# Patient Record
Sex: Female | Born: 1985 | Race: White | Hispanic: No | Marital: Married | Smoking: Never smoker
Health system: Southern US, Community
[De-identification: ages and names within clinical notes are randomized; demographics above are authoritative.]

## PROBLEM LIST (undated history)

## (undated) DIAGNOSIS — E119 Type 2 diabetes mellitus without complications: Secondary | ICD-10-CM

## (undated) DIAGNOSIS — E282 Polycystic ovarian syndrome: Secondary | ICD-10-CM

## (undated) HISTORY — PX: TONSILLECTOMY: SUR1361

## (undated) HISTORY — PX: BREAST SURGERY: SHX581

## (undated) HISTORY — PX: WISDOM TOOTH EXTRACTION: SHX21

---

## 2015-09-04 ENCOUNTER — Other Ambulatory Visit (HOSPITAL_COMMUNITY): Payer: Self-pay | Admitting: Unknown Physician Specialty

## 2015-09-04 DIAGNOSIS — Z3689 Encounter for other specified antenatal screening: Secondary | ICD-10-CM

## 2015-09-05 ENCOUNTER — Encounter (HOSPITAL_COMMUNITY): Payer: Self-pay | Admitting: Unknown Physician Specialty

## 2015-09-13 ENCOUNTER — Ambulatory Visit (HOSPITAL_COMMUNITY): Payer: Self-pay

## 2015-09-18 ENCOUNTER — Encounter (HOSPITAL_COMMUNITY): Payer: Self-pay

## 2015-09-18 ENCOUNTER — Ambulatory Visit (HOSPITAL_COMMUNITY)
Admission: RE | Admit: 2015-09-18 | Discharge: 2015-09-18 | Disposition: A | Discharge status: Home / Self Care | Payer: Managed Care, Other (non HMO) | Source: Ambulatory Visit | Attending: Unknown Physician Specialty | Admitting: Unknown Physician Specialty

## 2015-09-18 VITALS — BP 141/82 | HR 92 | Wt 253.4 lb

## 2015-09-18 DIAGNOSIS — O24419 Gestational diabetes mellitus in pregnancy, unspecified control: Secondary | ICD-10-CM | POA: Insufficient documentation

## 2015-09-18 DIAGNOSIS — Z3A15 15 weeks gestation of pregnancy: Secondary | ICD-10-CM | POA: Diagnosis not present

## 2015-09-18 DIAGNOSIS — O24912 Unspecified diabetes mellitus in pregnancy, second trimester: Secondary | ICD-10-CM

## 2015-09-18 DIAGNOSIS — Z9889 Other specified postprocedural states: Secondary | ICD-10-CM

## 2015-09-18 DIAGNOSIS — Z3689 Encounter for other specified antenatal screening: Secondary | ICD-10-CM

## 2015-09-18 HISTORY — DX: Polycystic ovarian syndrome: E28.2

## 2015-09-18 HISTORY — DX: Type 2 diabetes mellitus without complications: E11.9

## 2015-09-18 NOTE — Progress Notes (Signed)
MFM Consult  30 year old, G2P1001, currently at 15 weeks 5 days gestation with an EDD of 03/06/2016 seen in consultation today for maternal diabetes.   Past Medical History  Diagnosis Date  . Diabetes mellitus without complication (HCC)   . PCOS (polycystic ovarian syndrome)    Past Surgical History  Procedure Laterality Date  . Tonsillectomy    . Breast surgery      augmentation  x3  . Wisdom tooth extraction     NKDA  Impression and Recommendations #1 Gestational (probable type II diabetes given early onset of diagnosis with both her pregnancies) - she notes she had early diagnosis of diabetes during her last pregnancy, but that it appeared to resolve between pregnancy's. She was tested and diagnosed early this pregnancy and has a HgbA1c of 6.7. She was initially on oral hypoglycemics and converted to levimer about 2 weeks ago and was adjusted from 20 to 22 units SQ qHS. Will have her increase to 24 units and then if not in target range to 26 units of levimir. She is scheduled to meet with our diabetic educator in a couple weeks.  - fastings currently average 109 (target average <105 and ideally <95) - 1-hour postprandials appear less than 140's (target <140 on average) - targeted fetal anatomic survey ~19- [redacted] weeks gestation with pediatric fetal echo at ~[redacted] weeks gestation - serial ultrasounds for fetal growth at ~4 week intervals - maternal assessment of fetal movement starting ~[redacted] weeks gestation with immediate provider contact for decreased or cessation of fetal movement - twice weekly antenatal testing at ~32-[redacted] weeks gestation or as clinically indicated - delivery at ~[redacted] weeks gestation or as clinically indicated #2 History of shoulder dystocia - she notes that labor progressed well until the shoulder was stuck. The baby had a broken arm and dislocated shoulder per her report and did not breath spontaneously for 8 minutes after birth per the patient. The baby did not appear to  have residual symptoms at this point, but did have a delay in left arm function per the patient. The baby weighted 8 pounds 3 ounces at [redacted] weeks gestation - we discussed the risks benefits and alternatives of vaginal birth versus primary cesarean delivery - it appears that she is leaning strongly to primary cesarean delivery to avoid potential birth sequelae to the baby. #3 Family history of a cardiac defect - her brother weighed >11 pounds at birth and was noted to have 2 ventricular septal defects - would get a fetal echo evaluation with pediatric cardiology at ~[redacted] weeks gestation #4 Desire to breastfeed with a history of augmentation left breast and reduction right breast - consider lactation consultation antepartum in the 34-36 week range to coordinate feasibility and plan for after delivery  Questions appear answered to the couples satisfaction. Precautions for the above given. Spent greater than 1/2 of 40 minute visit face to face counseling

## 2015-09-27 ENCOUNTER — Ambulatory Visit (HOSPITAL_COMMUNITY): Payer: Self-pay

## 2015-10-18 ENCOUNTER — Other Ambulatory Visit (HOSPITAL_COMMUNITY): Payer: 59

## 2015-10-18 ENCOUNTER — Ambulatory Visit (HOSPITAL_COMMUNITY): Payer: Self-pay

## 2015-10-25 ENCOUNTER — Encounter: Payer: Managed Care, Other (non HMO) | Attending: Obstetrics | Admitting: *Deleted

## 2015-10-25 ENCOUNTER — Other Ambulatory Visit (HOSPITAL_COMMUNITY): Payer: Self-pay | Admitting: Unknown Physician Specialty

## 2015-10-25 ENCOUNTER — Encounter (HOSPITAL_COMMUNITY): Payer: Self-pay

## 2015-10-25 ENCOUNTER — Ambulatory Visit (HOSPITAL_COMMUNITY)
Admission: RE | Admit: 2015-10-25 | Discharge: 2015-10-25 | Disposition: A | Discharge status: Home / Self Care | Payer: Managed Care, Other (non HMO) | Source: Ambulatory Visit | Attending: Unknown Physician Specialty | Admitting: Unknown Physician Specialty

## 2015-10-25 DIAGNOSIS — Z029 Encounter for administrative examinations, unspecified: Secondary | ICD-10-CM | POA: Insufficient documentation

## 2015-10-25 DIAGNOSIS — O24419 Gestational diabetes mellitus in pregnancy, unspecified control: Secondary | ICD-10-CM

## 2015-10-25 DIAGNOSIS — Z3689 Encounter for other specified antenatal screening: Secondary | ICD-10-CM

## 2015-10-25 DIAGNOSIS — Z3A21 21 weeks gestation of pregnancy: Secondary | ICD-10-CM

## 2015-10-25 DIAGNOSIS — O24414 Gestational diabetes mellitus in pregnancy, insulin controlled: Secondary | ICD-10-CM | POA: Insufficient documentation

## 2015-10-25 DIAGNOSIS — Z36 Encounter for antenatal screening of mother: Secondary | ICD-10-CM | POA: Diagnosis not present

## 2015-10-25 DIAGNOSIS — O99212 Obesity complicating pregnancy, second trimester: Secondary | ICD-10-CM

## 2015-10-25 NOTE — Progress Notes (Signed)
  Patient was seen on 10/25/2015 for Gestational Diabetes self-management. She has a history of GDM with last pregnancy almost 6 years ago. She is already testing her BG @ 4 times a day and is taking Levemir insulin @ 28 units in the evening and Metformin @ 1000 mg in AM and 1500 mg with supper meal. She states she is finally able to tolerate the Metformin now. Diet recall indicates she is limiting her carbohydrate to 20 grams at breakfast, 30 grams at lunch and 20-25 grams at supper. I reviewed Carb Counting with her and recommended she increase her carb intake to 30 grams at breakfast and 30-45 grams at lunch and supper to provide adequate glucose to prevent ketone production. She had no questions regarding testing her BG or taking her insulin.   Patient instructed to monitor glucose levels: FBS: 60 - <90 1 hour: <140 2 hour: <120  *Patient received handouts:  Nutrition Diabetes and Pregnancy  Carbohydrate Counting List  Patient will be seen for follow-up as needed.

## 2016-07-23 ENCOUNTER — Encounter (HOSPITAL_COMMUNITY): Payer: Self-pay

## 2017-04-09 IMAGING — US US MFM OB DETAIL+14 WK
1 series · 14 of 28 positions shown · non-contrast
Comparison: none

[Series 1: us mfm ob detail+14 wk · 118 acquisitions, 14 frames shown]
[im 5/118]
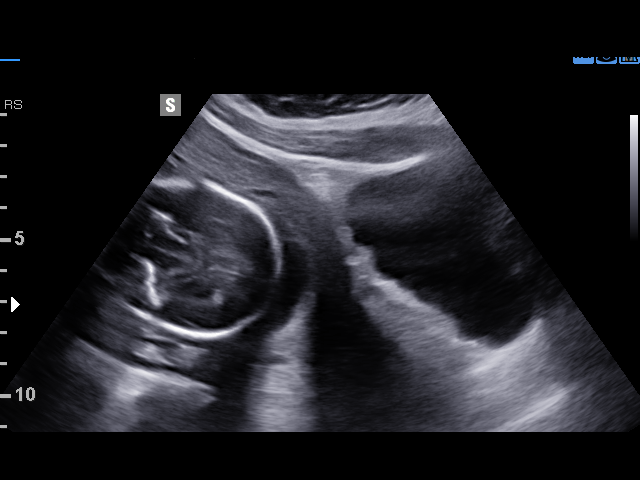
[im 14/118]
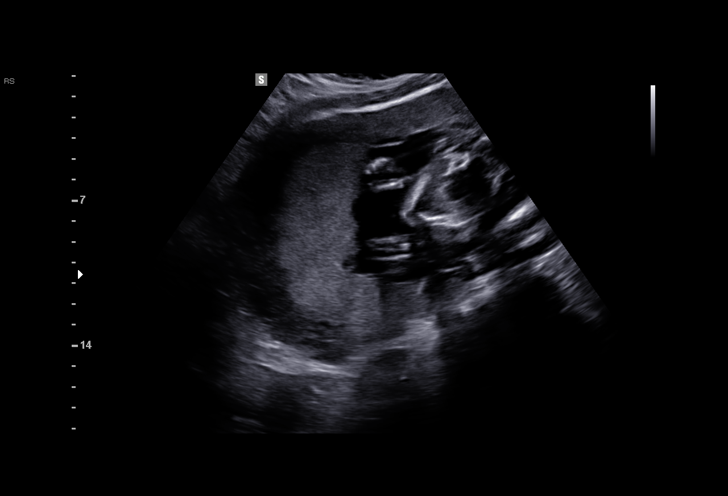
[im 22/118]
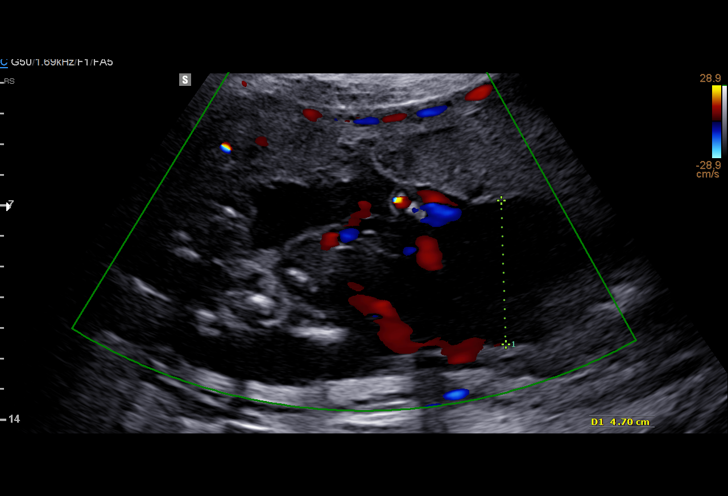
[im 31/118]
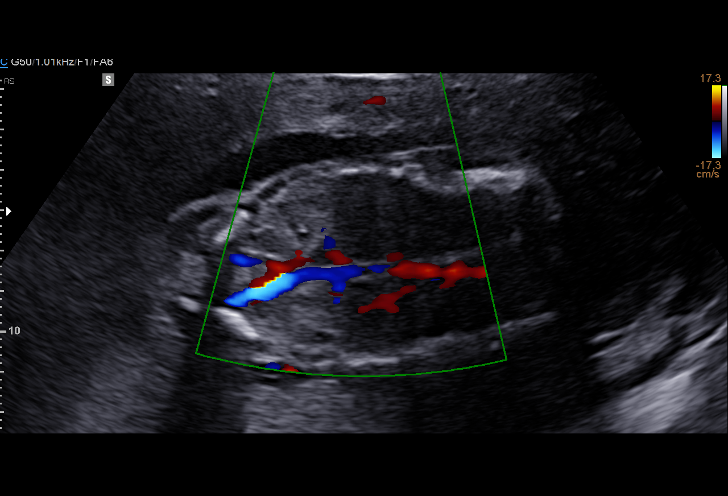
[im 40/118]
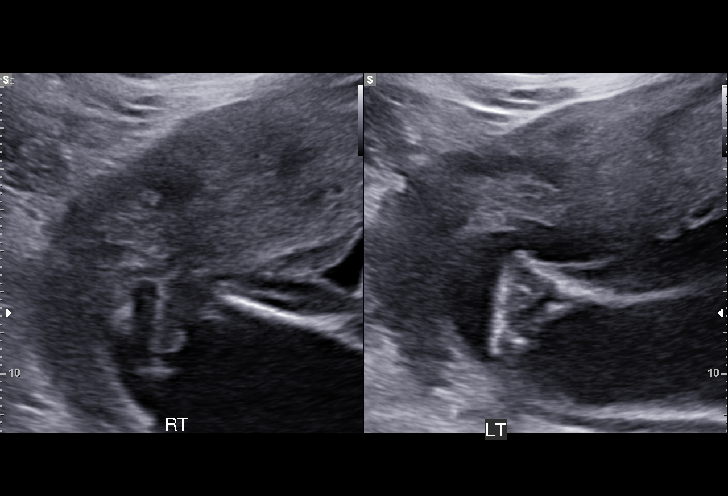
[im 48/118]
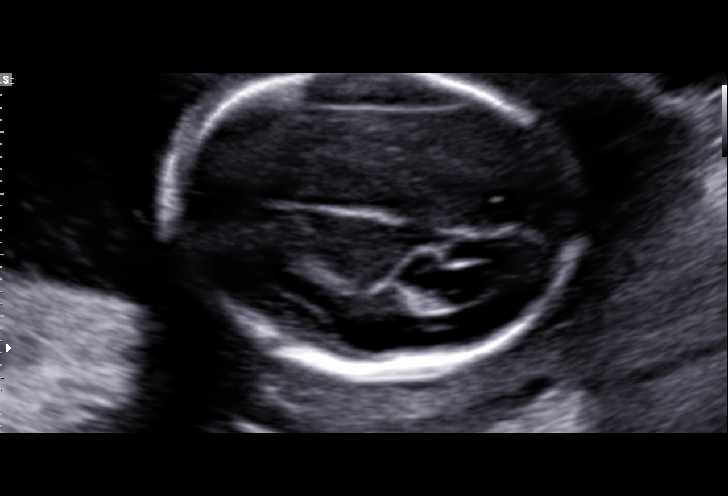
[im 57/118]
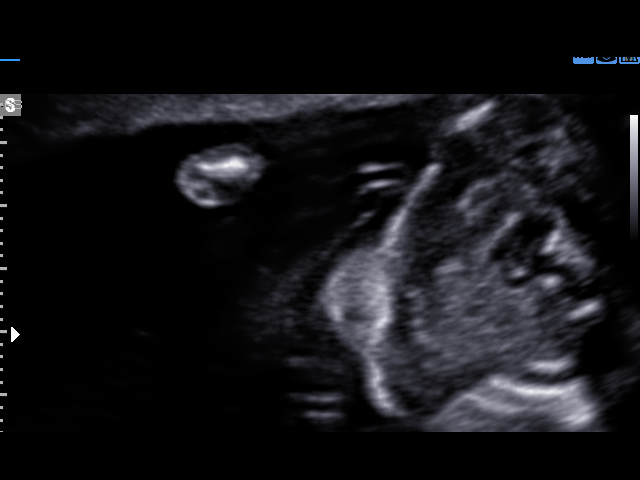
[im 66/118]
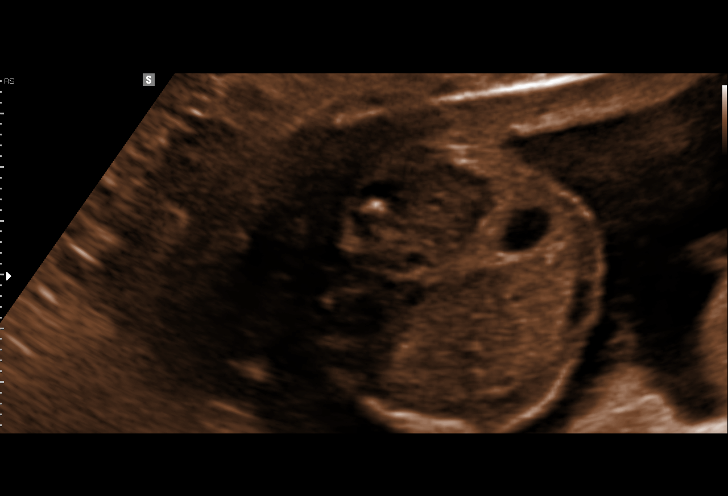
[im 74/118]
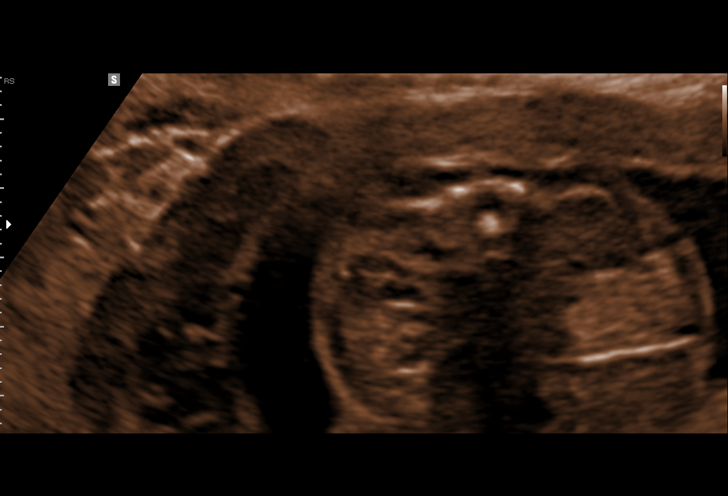
[im 83/118]
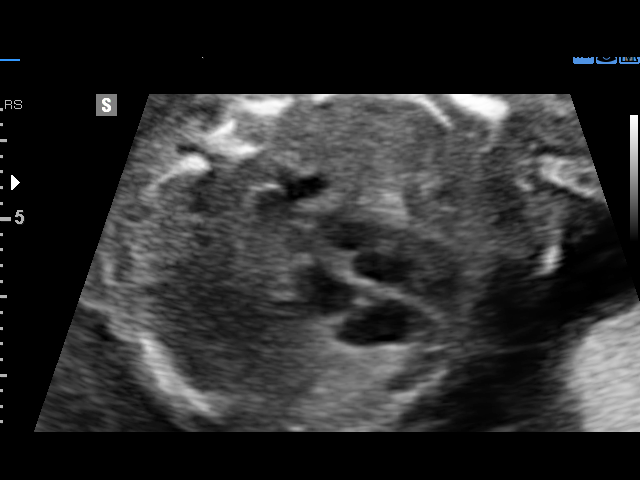
[im 92/118]
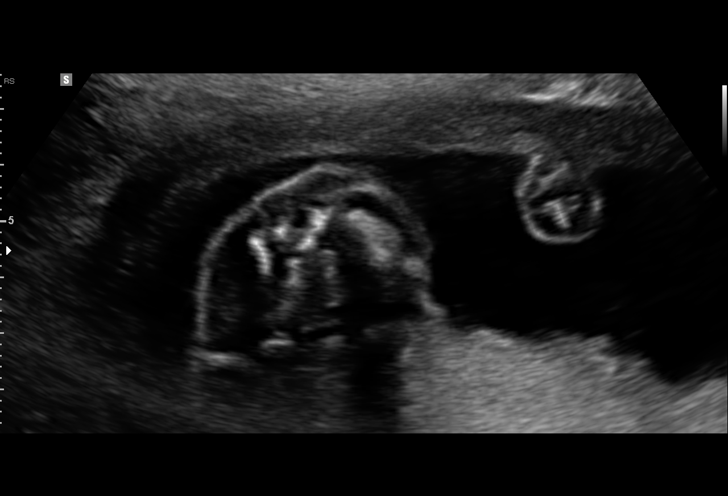
[im 100/118]
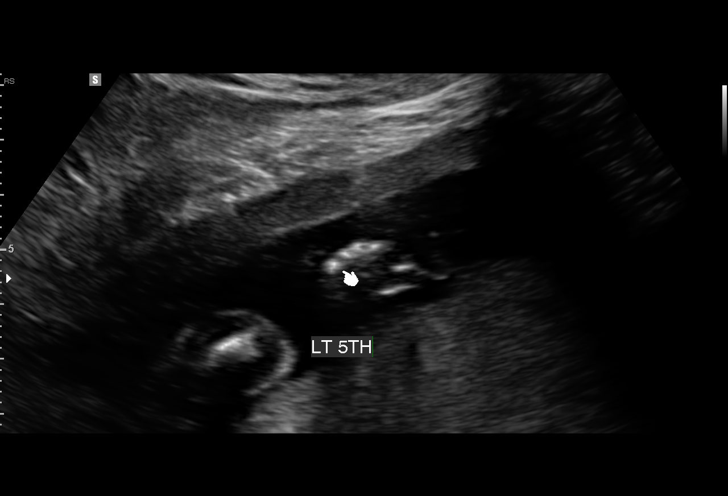
[im 109/118]
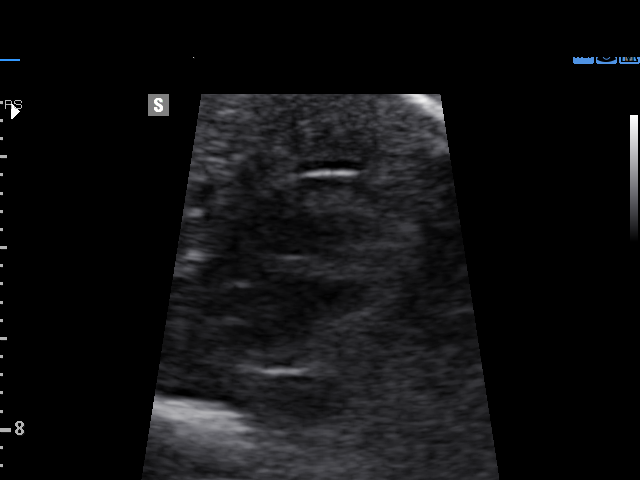
[im 118/118]
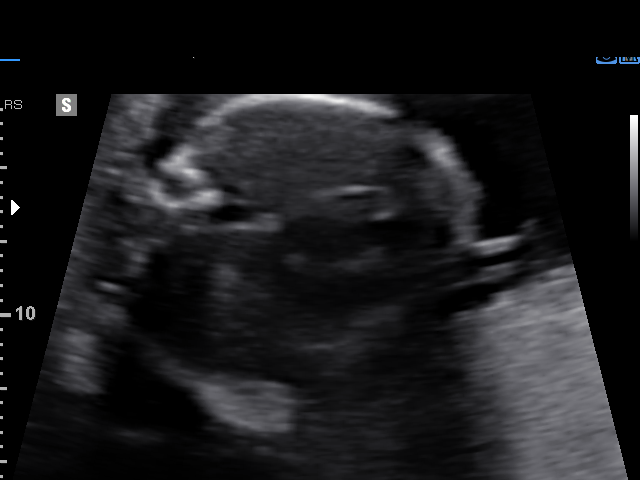

[14 of 28 positions shown; findings below may reference images not displayed]

Center
[REDACTED] [id number]

1  Sorinu Soldau           [id number]      [id number]     [id number]
Indications

21 weeks gestation of pregnancy
Detailed fetal anatomic survey                 Z36
Gestational diabetes in pregnancy, insulin
controlled (metformin, levemir)
Obesity complicating pregnancy, second
trimester
OB History

Gravidity:    2         Term:   1
Living:       1
Fetal Evaluation

Num Of Fetuses:     1
Fetal Heart         168
Rate(bpm):
Cardiac Activity:   Observed
Presentation:       Variable
Placenta:           Fundal, above cervical os
P. Cord Insertion:  Visualized, central

Amniotic Fluid
AFI FV:      Subjectively within normal limits

Largest Pocket(cm)
4.70
Biometry
BPD:      49.9  mm     G. Age:  21w 0d         53  %    CI:         68.21  %    70 - 86
FL/HC:       18.0  %    15.9 -
HC:      193.2  mm     G. Age:  21w 4d         65  %    HC/AC:       1.07       1.06 -
AC:      180.9  mm     G. Age:  22w 6d         92  %    FL/BPD:      69.7  %
FL:       34.8  mm     G. Age:  21w 0d         40  %    FL/AC:       19.2  %    20 - 24
CER:      23.2  mm     G. Age:  21w 4d         68  %

CM:        4.6  mm

Est. FW:     465   gm          1 lb     59  %
Gestational Age

LMP:           21w 0d        Date:  05/31/15                 EDD:    03/06/16
U/S Today:     21w 4d                                        EDD:    03/02/16
Best:          21w 0d     Det. By:  LMP  (05/31/15)          EDD:    03/06/16
Anatomy

Cranium:               Appears normal         Aortic Arch:            Appears normal
Cavum:                 Appears normal         Ductal Arch:            Appears normal
Ventricles:            Appears normal         Diaphragm:              Appears normal
Choroid Plexus:        Appears normal         Stomach:                Appears normal, left
sided
Cerebellum:            Appears normal         Abdomen:                Appears normal
Posterior Fossa:       Appears normal         Abdominal Wall:         Appears nml (cord
insert, abd wall)
Nuchal Fold:           Not applicable (>20    Cord Vessels:           Appears normal (3
wks GA)                                        vessel cord)
Face:                  Orbits nl; profile not Kidneys:                Appear normal
well visualized
Lips:                  Appears normal         Bladder:                Appears normal
Thoracic:              Appears normal         Spine:                  Appears normal
Heart:                 Appears normal         Upper Extremities:      Appears normal
(4CH, axis, and situs
RVOT:                  Appears normal         Lower Extremities:      Appears normal
LVOT:                  Appears normal

Other:  Technically difficult due to maternal habitus and fetal position. Fetus
appears to be a female. Heels and 5th digit visualized.
Cervix Uterus Adnexa

Cervix
Length:           4.56  cm.
Normal appearance by transabdominal scan.

Uterus
No abnormality visualized.

Left Ovary
Not visualized.

Right Ovary
Not visualized.

Adnexa:       No abnormality visualized. No adnexal mass
visualized.
Impression

SIUP at 21+0 weeks
Normal detailed fetal anatomy; limited views of profile; heart
views visualized with color flow
Normal amniotic fluid volume
Measurements consistent with LMP dating
Recommendations

Serial ultrasounds for growth
We would be happy to perform these exams.  If desired,
please call to schedule.
Antenatal testing at 32 weeks
